# Patient Record
Sex: Male | Born: 2000
Health system: Southern US, Community
[De-identification: ages and names within clinical notes are randomized; demographics above are authoritative.]

## PROBLEM LIST (undated history)

## (undated) DIAGNOSIS — G43909 Migraine, unspecified, not intractable, without status migrainosus: Secondary | ICD-10-CM

---
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
## 2021-04-22 ENCOUNTER — Other Ambulatory Visit: Payer: Self-pay

## 2021-04-22 ENCOUNTER — Encounter (HOSPITAL_BASED_OUTPATIENT_CLINIC_OR_DEPARTMENT_OTHER): Payer: Self-pay

## 2021-04-22 ENCOUNTER — Emergency Department (HOSPITAL_BASED_OUTPATIENT_CLINIC_OR_DEPARTMENT_OTHER)
Admission: EM | Admit: 2021-04-22 | Discharge: 2021-04-22 | Disposition: A | Discharge status: Home / Self Care | Payer: 59 | Attending: Emergency Medicine | Admitting: Emergency Medicine

## 2021-04-22 DIAGNOSIS — J020 Streptococcal pharyngitis: Secondary | ICD-10-CM | POA: Insufficient documentation

## 2021-04-22 DIAGNOSIS — J029 Acute pharyngitis, unspecified: Secondary | ICD-10-CM | POA: Diagnosis present

## 2021-04-22 LAB — GROUP A STREP BY PCR: Group A Strep by PCR: DETECTED — AB

## 2021-04-22 MED ORDER — PENICILLIN G BENZATHINE 1200000 UNIT/2ML IM SUSY
1.2000 10*6.[IU] | PREFILLED_SYRINGE | Freq: Once | INTRAMUSCULAR | Status: AC
  Administered 2021-04-22: 1.2 10*6.[IU] via INTRAMUSCULAR
  Filled 2021-04-22: qty 2

## 2021-04-22 MED ORDER — PENICILLIN G BENZATHINE & PROC 1200000 UNIT/2ML IM SUSP: 2.4000 10*6.[IU] | Freq: Once | INTRAMUSCULAR | Status: DC

## 2021-04-22 MED ORDER — DEXAMETHASONE SODIUM PHOSPHATE 10 MG/ML IJ SOLN
10.0000 mg | Freq: Once | INTRAMUSCULAR | Status: AC
  Administered 2021-04-22: 10 mg via INTRAMUSCULAR
  Filled 2021-04-22: qty 1

## 2021-04-22 NOTE — ED Provider Notes (Signed)
[facility] EMERGENCY DEPARTMENT Provider Note  CSN: [id number] Arrival date & time: 04/22/21 2029  Chief Complaint(s) Sore Throat  HPI Daryl Dickson. is a 20 y.o. male    Sore Throat This is a new problem. The current episode started 2 days ago. The problem occurs constantly. The problem has been gradually worsening. Pertinent negatives include no chest pain, no abdominal pain, no headaches and no shortness of breath. The symptoms are aggravated by swallowing. Nothing relieves the symptoms. He has tried nothing for the symptoms.   Past Medical History Past Medical History:  Diagnosis Date   Migraine    Patient Active Problem List   Diagnosis Date Noted   Foot pain, right 08/06/2019   Patellar tendinitis of both knees 03/04/2018   Concussion without loss of consciousness 03/19/2017   Toe fracture, right 04/04/2016   Left wrist injury 01/06/2016   Home Medication(s) Prior to Admission medications   Medication Sig Start Date End Date Taking? Authorizing Provider  EPINEPHrine 0.3 mg/0.3 mL IJ SOAJ injection Inject 0.3 mLs (0.3 mg total) into the muscle as needed for anaphylaxis. 05/19/19   Copland, Gwenlyn Found, MD  Menaquinone-7 (VITAMIN K2 PO) Take by mouth.    [provider]  rizatriptan (MAXALT) 10 MG tablet TAKE 1 TABLET BY MOUTH AS NEEDED FOR MIGRAINE 06/22/20   Copland, Gwenlyn Found, MD  Vitamin D, Ergocalciferol, (DRISDOL) 1.25 MG (50000 UT) CAPS capsule Take 1 capsule (50,000 Units total) by mouth every 7 (seven) days. 07/31/18   Judi Saa, DO                                                                                                                                    Past Surgical History History reviewed. No pertinent surgical history. Family History Family History  Problem Relation Age of Onset   Cancer Mother    Breast cancer Mother    Cancer Father     Social History Social History   Tobacco Use   Smoking status: Never   Smokeless  tobacco: Never  Vaping Use   Vaping Use: Never used  Substance Use Topics   Alcohol use: No    Alcohol/week: 0.0 standard drinks   Drug use: No   Allergies Shellfish allergy  Review of Systems Review of Systems  Respiratory:  Negative for shortness of breath.   Cardiovascular:  Negative for chest pain.  Gastrointestinal:  Negative for abdominal pain.  Neurological:  Negative for headaches.  All other systems are reviewed and are negative for acute change except as noted in the HPI  Physical Exam Vital Signs  I have reviewed the triage vital signs BP 127/71 (BP Location: Left Arm)   Pulse 72   Temp 99.4 F (37.4 C) (Oral)   Resp 18   Ht 6\' 1"  (1.854 m)   Wt 77.6 kg   SpO2 100%   BMI 22.56 kg/m   Physical  Exam Vitals reviewed.  Constitutional:      General: He is not in acute distress.    Appearance: He is well-developed. He is not diaphoretic.  HENT:     Head: Normocephalic and atraumatic.     Jaw: No trismus.     Right Ear: External ear normal.     Left Ear: External ear normal.     Nose: Nose normal.     Mouth/Throat:     Mouth: Mucous membranes are moist.     Pharynx: No posterior oropharyngeal erythema or uvula swelling.     Tonsils: Tonsillar exudate present. No tonsillar abscesses. 2+ on the right. 2+ on the left.  Eyes:     General: No scleral icterus.    Conjunctiva/sclera: Conjunctivae normal.  Neck:     Trachea: Phonation normal.  Cardiovascular:     Rate and Rhythm: Normal rate and regular rhythm.  Pulmonary:     Effort: Pulmonary effort is normal. No respiratory distress.     Breath sounds: No stridor.  Abdominal:     General: There is no distension.  Musculoskeletal:        General: Normal range of motion.     Cervical back: Normal range of motion.  Lymphadenopathy:     Cervical: Cervical adenopathy present.  Neurological:     Mental Status: He is alert and oriented to person, place, and time.  Psychiatric:        Behavior: Behavior  normal.    ED Results and Treatments Labs (all labs ordered are listed, but only abnormal results are displayed) Labs Reviewed  GROUP A STREP BY PCR - Abnormal; Notable for the following components:      Result Value   Group A Strep by PCR DETECTED (*)    All other components within normal limits                                                                                                                         EKG  EKG Interpretation  Date/Time:    Ventricular Rate:    PR Interval:    QRS Duration:   QT Interval:    QTC Calculation:   R Axis:     Text Interpretation:         Radiology No results found.  Pertinent labs & imaging results that were available during my care of the patient were reviewed by me and considered in my medical decision making (see chart for details).  Medications Ordered in ED Medications  dexamethasone (DECADRON) injection 10 mg (10 mg Intramuscular Given 04/22/21 2140)  penicillin g benzathine (BICILLIN LA) 1200000 UNIT/2ML injection 1.2 Million Units (1.2 Million Units Intramuscular Given 04/22/21 2141)  Procedures Procedures  (including critical care time)  Medical Decision Making / ED Course I have reviewed the nursing notes for this encounter and the patient's prior records (if available in EHR or on provided paperwork).   Daryl Tromp. was evaluated in Emergency Department on 04/23/2021 for the symptoms described in the history of present illness. He was evaluated in the context of the global COVID-19 pandemic, which necessitated consideration that the patient might be at risk for infection with the SARS-CoV-2 virus that causes COVID-19. Institutional protocols and algorithms that pertain to the evaluation of patients at risk for COVID-19 are in a state of rapid change based on information released by  regulatory bodies including the CDC and federal and state organizations. These policies and algorithms were followed during the patient's care in the ED.  +strep throat. IM decadron and bicillin given      Final Clinical Impression(s) / ED Diagnoses Final diagnoses:  Strep pharyngitis    The patient appears reasonably screened and/or stabilized for discharge and I doubt any other medical condition or other [facility] requiring further screening, evaluation, or treatment in the ED at this time prior to discharge. Safe for discharge with strict return precautions.  Disposition: Discharge  Condition: Good  I have discussed the results, Dx and Tx plan with the patient/family who expressed understanding and agree(s) with the plan. Discharge instructions discussed at length. The patient/family was given strict return precautions who verbalized understanding of the instructions. No further questions at time of discharge.    ED Discharge Orders     None        Follow Up: Copland, Gwenlyn Found, MD [address] [place] [address] [phone]  Call  as needed     This chart was dictated using voice recognition software.  Despite best efforts to proofread,  errors can occur which can change the documentation meaning.    Nira Conn, MD 04/23/21 [phone]

## 2021-04-22 NOTE — ED Triage Notes (Signed)
Pt c/o sore throat, fever, chills started yesterday-NAD-steady gait

## 2021-05-30 ENCOUNTER — Other Ambulatory Visit: Payer: Self-pay | Admitting: Family Medicine

## 2021-05-30 DIAGNOSIS — G43009 Migraine without aura, not intractable, without status migrainosus: Secondary | ICD-10-CM

## 2021-06-24 ENCOUNTER — Other Ambulatory Visit: Payer: Self-pay | Admitting: Family Medicine

## 2021-06-24 DIAGNOSIS — G43009 Migraine without aura, not intractable, without status migrainosus: Secondary | ICD-10-CM

## 2022-02-10 NOTE — Progress Notes (Signed)
[facility] at [facility] [address] [place], [place] [address] [phone]  Date:  02/13/2022   Name:  Mackay Hanauer.   DOB:  02/01/2001   MRN:  [id number]  PCP:  Pearline Cables, MD    Chief Complaint: Annual Exam (Concerns/ questions: none)   History of Present Illness:  Kutler Vanvranken. is a 21 y.o. 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  Patient notes he had chlamydia last year, he was treated Covid booster HPV, meningitis UPD  He may get a migraine 2-3x a month-would like a refill of his Maxalt Headaches have not worsened recently   Patient Active Problem List   Diagnosis Date Noted   Foot pain, right 08/06/2019   Patellar tendinitis of both knees 03/04/2018   Concussion without loss of consciousness 03/19/2017   Toe fracture, right 04/04/2016   Left wrist injury 01/06/2016    Past Medical History:  Diagnosis Date   Migraine     No past surgical history on file.  Social History   Tobacco Use   Smoking status: Never   Smokeless tobacco: Never  Vaping Use   Vaping Use: Never used  Substance Use Topics   Alcohol use: No    Alcohol/week: 0.0 standard drinks   Drug use: No    Family History  Problem Relation Age of Onset   Cancer Mother    Breast cancer Mother    Cancer Father     Allergies  Allergen Reactions   Shellfish Allergy Hives    Medication list has been reviewed and updated.  Current Outpatient  Medications on File Prior to Visit  Medication Sig Dispense Refill   EPINEPHrine 0.3 mg/0.3 mL IJ SOAJ injection Inject 0.3 mLs (0.3 mg total) into the muscle as needed for anaphylaxis. 1 each prn   rizatriptan (MAXALT) 10 MG tablet TAKE 1 TABLET BY MOUTH AS NEEDED FOR MIGRAINE. 4 tablet 0   No current facility-administered medications on file prior to visit.    Review of Systems:  As per HPI- otherwise negative.   Physical Examination: Vitals:   02/13/22 1446  BP: 122/80  Pulse: 79  Resp: 18  Temp: 98.1 F (36.7 C)  SpO2: 99%   Vitals:   02/13/22 1446  Weight: 174 lb 3.2 oz (79 kg)  Height: 6' 0.52" (1.842 m)   Body mass index is 23.29 kg/m. Ideal Body Weight: Weight in (lb) to have BMI = 25: 186.6  GEN: no acute distress, normal weight, looks well Tonsillary hypertrophy is present bilaterally Bilateral TM wnl, oropharynx normal.  PEERL,EOMI.   HEENT: Atraumatic, Normocephalic.  Ears and Nose: No external deformity. CV: RRR, No M/G/R. No JVD. No thrill. No extra heart sounds. PULM: CTA B, no wheezes, crackles, rhonchi. No retractions. No resp. distress. No accessory muscle use. ABD: S, NT, ND, +BS. No rebound. No HSM. EXTR: No c/c/e PSYCH: Normally interactive. Conversant.  Patient and his parent note he tends to get pharyngitis at least 2 or 3 times a year Assessment and Plan: Physical exam  Migraine without aura and without status migrainosus, not intractable - Plan: rizatriptan (MAXALT) 10 MG tablet  Tonsillar hypertrophy - Plan: Ambulatory referral to ENT  Routine screening for STI (sexually transmitted infection) - Plan: Hepatitis B surface antigen, Hepatitis C antibody, HIV Antibody (routine testing w rflx), RPR, GC/Chlamydia Probe Amp, Hepatitis B surface antibody,quantitative, CANCELED: Hepatitis B surface antibody,quantitative, CANCELED: GC/Chlamydia Probe Amp, CANCELED: GC/Chlamydia Probe Amp  Screening, deficiency anemia, iron - Plan: CBC  Screening  for diabetes mellitus - Plan: Comprehensive metabolic panel, Hemoglobin A1c  Screening for hyperlipidemia - Plan: Lipid panel  Patient seen today for physical He plans to come back for lab work as he needs to attend an appointment They are potentially interested in having a tonsillectomy, referral made for ENT consultation Refilled Maxalt, let me know if any change or worsening of headaches  Signed Abbe Amsterdam, MD

## 2022-02-13 ENCOUNTER — Ambulatory Visit (INDEPENDENT_AMBULATORY_CARE_PROVIDER_SITE_OTHER): Payer: 59 | Admitting: Family Medicine

## 2022-02-13 VITALS — BP 122/80 | HR 79 | Temp 98.1°F | Resp 18 | Ht 72.52 in | Wt 174.2 lb

## 2022-02-13 DIAGNOSIS — J351 Hypertrophy of tonsils: Secondary | ICD-10-CM | POA: Diagnosis not present

## 2022-02-13 DIAGNOSIS — Z131 Encounter for screening for diabetes mellitus: Secondary | ICD-10-CM

## 2022-02-13 DIAGNOSIS — Z0000 Encounter for general adult medical examination without abnormal findings: Secondary | ICD-10-CM

## 2022-02-13 DIAGNOSIS — G43009 Migraine without aura, not intractable, without status migrainosus: Secondary | ICD-10-CM

## 2022-02-13 DIAGNOSIS — Z130 Encounter for screening for diseases of the blood and blood-forming organs and certain disorders involving the immune mechanism: Secondary | ICD-10-CM

## 2022-02-13 DIAGNOSIS — Z113 Encounter for screening for infections with a predominantly sexual mode of transmission: Secondary | ICD-10-CM

## 2022-02-13 DIAGNOSIS — Z13220 Encounter for screening for lipoid disorders: Secondary | ICD-10-CM

## 2022-02-13 MED ORDER — RIZATRIPTAN BENZOATE 10 MG PO TABS: 10.0000 mg | ORAL_TABLET | ORAL | 3 refills | Status: DC | PRN

## 2022-02-15 ENCOUNTER — Other Ambulatory Visit (INDEPENDENT_AMBULATORY_CARE_PROVIDER_SITE_OTHER): Payer: 59

## 2022-02-15 DIAGNOSIS — Z113 Encounter for screening for infections with a predominantly sexual mode of transmission: Secondary | ICD-10-CM

## 2022-02-15 DIAGNOSIS — Z13220 Encounter for screening for lipoid disorders: Secondary | ICD-10-CM

## 2022-02-15 DIAGNOSIS — Z131 Encounter for screening for diabetes mellitus: Secondary | ICD-10-CM | POA: Diagnosis not present

## 2022-02-15 DIAGNOSIS — Z130 Encounter for screening for diseases of the blood and blood-forming organs and certain disorders involving the immune mechanism: Secondary | ICD-10-CM

## 2022-02-15 LAB — LIPID PANEL
Cholesterol: 169 mg/dL (ref 0–200)
HDL: 69.2 mg/dL (ref >39.00)
LDL Cholesterol: 89 mg/dL (ref 0–99)
NonHDL: 99.43
Total CHOL/HDL Ratio: 2
Triglycerides: 50 mg/dL (ref 0.0–149.0)
VLDL: 10 mg/dL (ref 0.0–40.0)

## 2022-02-15 LAB — CBC
HCT: 42.1 % (ref 36.0–49.0)
Hemoglobin: 13.4 g/dL (ref 12.0–16.0)
MCHC: 31.9 g/dL (ref 31.0–37.0)
MCV: 85.2 fl (ref 78.0–98.0)
Platelets: 205 10*3/uL (ref 150.0–575.0)
RBC: 4.94 Mil/uL (ref 3.80–5.70)
RDW: 13.8 % (ref 11.4–15.5)
WBC: 8.1 10*3/uL (ref 4.5–13.5)

## 2022-02-15 LAB — COMPREHENSIVE METABOLIC PANEL
ALT: 20 U/L (ref 0–53)
AST: 27 U/L (ref 0–37)
Albumin: 4.8 g/dL (ref 3.5–5.2)
Alkaline Phosphatase: 59 U/L (ref 52–171)
BUN: 16 mg/dL (ref 6–23)
CO2: 27 mEq/L (ref 19–32)
Calcium: 9.8 mg/dL (ref 8.4–10.5)
Chloride: 105 mEq/L (ref 96–112)
Creatinine, Ser: 0.99 mg/dL (ref 0.40–1.50)
GFR: 110.07 mL/min (ref >60.00)
Glucose, Bld: 83 mg/dL (ref 70–99)
Potassium: 4.4 mEq/L (ref 3.5–5.1)
Sodium: 141 mEq/L (ref 135–145)
Total Bilirubin: 0.6 mg/dL (ref 0.2–1.2)
Total Protein: 7.8 g/dL (ref 6.0–8.3)

## 2022-02-15 LAB — GC/CHLAMYDIA PROBE AMP
Chlamydia trachomatis, NAA: NEGATIVE
Neisseria Gonorrhoeae by PCR: NEGATIVE

## 2022-02-15 LAB — HEMOGLOBIN A1C: Hgb A1c MFr Bld: 4.6 % (ref 4.6–6.5)

## 2022-02-16 LAB — HEPATITIS C ANTIBODY
Hepatitis C Ab: NONREACTIVE
SIGNAL TO CUT-OFF: 0.14 (ref <1.00)

## 2022-02-16 LAB — HEPATITIS B SURFACE ANTIGEN: Hepatitis B Surface Ag: NONREACTIVE

## 2022-02-16 LAB — RPR: RPR Ser Ql: NONREACTIVE

## 2022-02-16 LAB — HEPATITIS B SURFACE ANTIBODY, QUANTITATIVE: Hep B S AB Quant (Post): 8 m[IU]/mL — ABNORMAL LOW (ref >=10)

## 2022-02-16 LAB — HIV ANTIBODY (ROUTINE TESTING W REFLEX): HIV 1&2 Ab, 4th Generation: NONREACTIVE

## 2022-02-16 NOTE — Progress Notes (Signed)
Received labs- called pt to give results as he does not have mychart and may not want labs sent to his mom   Results for orders placed or performed in visit on 02/15/22  Hepatitis B surface antibody,quantitative  Result Value Ref Range   Hepatitis B-Post 8 (L) > OR = 10 mIU/mL  RPR  Result Value Ref Range   RPR Ser Ql NON-REACTIVE NON-REACTIVE  HIV Antibody (routine testing w rflx)  Result Value Ref Range   HIV 1&2 Ab, 4th Generation NON-REACTIVE NON-REACTIVE  Hepatitis B surface antigen  Result Value Ref Range   Hepatitis B Surface Ag NON-REACTIVE NON-REACTIVE  Lipid panel  Result Value Ref Range   Cholesterol 169 0 - 200 mg/dL   Triglycerides 23.5 0.0 - 149.0 mg/dL   HDL 57.32 >20.25 mg/dL   VLDL 42.7 0.0 - 06.2 mg/dL   LDL Cholesterol 89 0 - 99 mg/dL   Total CHOL/HDL Ratio 2    NonHDL 99.43   Hemoglobin A1c  Result Value Ref Range   Hgb A1c MFr Bld 4.6 4.6 - 6.5 %  Comprehensive metabolic panel  Result Value Ref Range   Sodium 141 135 - 145 mEq/L   Potassium 4.4 3.5 - 5.1 mEq/L   Chloride 105 96 - 112 mEq/L   CO2 27 19 - 32 mEq/L   Glucose, Bld 83 70 - 99 mg/dL   BUN 16 6 - 23 mg/dL   Creatinine, Ser 3.76 0.40 - 1.50 mg/dL   Total Bilirubin 0.6 0.2 - 1.2 mg/dL   Alkaline Phosphatase 59 52 - 171 U/L   AST 27 0 - 37 U/L   ALT 20 0 - 53 U/L   Total Protein 7.8 6.0 - 8.3 g/dL   Albumin 4.8 3.5 - 5.2 g/dL   GFR 283.15 >17.61 mL/min   Calcium 9.8 8.4 - 10.5 mg/dL  CBC  Result Value Ref Range   WBC 8.1 4.5 - 13.5 K/uL   RBC 4.94 3.80 - 5.70 Mil/uL   Platelets 205.0 150.0 - 575.0 K/uL   Hemoglobin 13.4 12.0 - 16.0 g/dL   HCT 60.7 37.1 - 06.2 %   MCV 85.2 78.0 - 98.0 fl   MCHC 31.9 31.0 - 37.0 g/dL   RDW 69.4 85.4 - 62.7 %

## 2022-04-21 NOTE — Progress Notes (Unsigned)
[facility] at [facility] [address], [address] [place], [place] [address] [phone]  Date:  04/24/2022   Name:  Daryl Dickson.   DOB:  2001-02-19   MRN:  [id number]  PCP:  Pearline Cables, MD    Chief Complaint: No chief complaint on file.   History of Present Illness:  Daryl Dickson. is a 21 y.o. very pleasant male patient who presents with the following:  Pt seen today to discuss migraine headache Last seen by myself in May for his physical  : Last seen by myself 6/21-at that time he had recently graduated from high school. 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  Social History   Tobacco Use   Smoking status: Never   Smokeless tobacco: Never  Vaping Use   Vaping Use: Never used  Substance Use Topics   Alcohol use: No    Alcohol/week: 0.0 standard drinks of alcohol   Drug use: No    Family History  Problem Relation Age of Onset   Cancer Mother    Breast cancer Mother    Cancer Father     Allergies  Allergen Reactions   Shellfish Allergy Hives    Medication list has been reviewed and updated.  Current Outpatient Medications on File Prior to Visit  Medication Sig Dispense Refill    EPINEPHrine 0.3 mg/0.3 mL IJ SOAJ injection Inject 0.3 mLs (0.3 mg total) into the muscle as needed for anaphylaxis. 1 each prn   rizatriptan (MAXALT) 10 MG tablet Take 1 tablet (10 mg total) by mouth as needed for migraine. May repeat in 2 hours if needed 12 tablet 3   No current facility-administered medications on file prior to visit.    Review of Systems:  As per HPI- otherwise negative.   Physical Examination: There were no vitals filed for this visit. There were no vitals filed for this visit. There is no height or weight on file to calculate BMI. Ideal Body Weight:    GEN: no acute distress. HEENT: Atraumatic, Normocephalic.  Ears and Nose: No external deformity. CV: RRR, No M/G/R. No JVD. No thrill. No extra heart sounds. PULM: CTA B, no wheezes, crackles, rhonchi. No retractions. No resp. distress. No accessory muscle use. ABD: S, NT, ND, +BS. No rebound. No HSM. EXTR: No c/c/e PSYCH: Normally interactive. Conversant.    Assessment and Plan: ***  Signed Abbe Amsterdam, MD

## 2022-04-24 ENCOUNTER — Ambulatory Visit: Payer: 59 | Admitting: Family Medicine

## 2022-04-24 ENCOUNTER — Telehealth: Payer: Self-pay | Admitting: Family Medicine

## 2022-04-24 VITALS — BP 108/62 | HR 75 | Temp 97.6°F | Resp 18 | Ht 72.52 in | Wt 173.8 lb

## 2022-04-24 DIAGNOSIS — G43009 Migraine without aura, not intractable, without status migrainosus: Secondary | ICD-10-CM

## 2022-04-24 MED ORDER — NURTEC 75 MG PO TBDP: 75.0000 mg | ORAL_TABLET | Freq: Every day | ORAL | 1 refills | Status: DC | PRN

## 2022-04-24 NOTE — Telephone Encounter (Signed)
PA initiated via Covermymeds; KEY: [id number].   Plan requires OV notes- I'm unable to attach them via covermymeds; I have asked plan to call us with their fax number.

## 2022-04-24 NOTE — Patient Instructions (Signed)
It was good to see you today- let's try Nurtec for your headaches.  Take at the first sign of a migraine Please let me know . . .  -if you have any trouble getting this paid for by your insurance and filled -how it works for you!   Take care and best of luck in [place]!

## 2022-04-25 MED ORDER — NURTEC 75 MG PO TBDP: 75.0000 mg | ORAL_TABLET | Freq: Every day | ORAL | 3 refills | Status: DC | PRN

## 2022-04-25 NOTE — Telephone Encounter (Signed)
FYI. PA denied. However plan does cover 8 tablets per month.   The request for 15 NURTEC TAB 75MG  ODT per month is denied. This decision is based on health plan criteria for NURTEC TAB 75MG  ODT. More than 8 tablets per month is covered only if: You require treatment of more than eight migraines per month. The information provided does not show that you meet the criteria listed above. *Please note: NURTEC TAB 75MG  ODT has been approved for up to 8 per month until 04/25/2023 or until coverage for the medication is no longer available under your benefit plan or the medication becomes subject to a pharmacy benefit coverage requirement, such as supply limits or notification, whichever occurs first as allowed by law.

## 2022-04-25 NOTE — Telephone Encounter (Signed)
See below

## 2022-10-13 ENCOUNTER — Other Ambulatory Visit: Payer: Self-pay | Admitting: Family Medicine

## 2022-10-13 DIAGNOSIS — G43009 Migraine without aura, not intractable, without status migrainosus: Secondary | ICD-10-CM

## 2023-09-23 ENCOUNTER — Telehealth: Payer: Self-pay | Admitting: Family Medicine

## 2023-09-23 DIAGNOSIS — G43009 Migraine without aura, not intractable, without status migrainosus: Secondary | ICD-10-CM

## 2023-09-24 ENCOUNTER — Other Ambulatory Visit (HOSPITAL_COMMUNITY): Payer: Self-pay

## 2023-09-24 NOTE — Telephone Encounter (Signed)
History of trial/failure of 2 triptan therapies required or history of contraindication of 2 triptan therapies. Please advise as chart states that patient has only tried rizatriptan.

## 2023-09-25 NOTE — Telephone Encounter (Signed)
 Left vm to return call.

## 2024-02-25 NOTE — Progress Notes (Unsigned)
 [facility] at [facility] [address] [place], [place] [address] [phone]  Date:  02/27/2024   Name:  Daryl Dickson.   DOB:  12-Jan-2001   MRN:  [id number]  PCP:  Kaylee Partridge, MD    Chief Complaint: No chief complaint on file.   History of Present Illness:  Daryl Grill. is a 23 y.o. very pleasant male patient who presents with the following:  Today for medication follow-up Most recent visit with myself was about 2 years ago-that time his migraine headaches were relatively stable but still significant.  We tried him on Nurtec-previously had only used Maxalt   Can update blood work today Due for tetanus booster  Patient Active Problem List   Diagnosis Date Noted   Foot pain, right 08/06/2019   Patellar tendinitis of both knees 03/04/2018   Concussion without loss of consciousness 03/19/2017   Toe fracture, right 04/04/2016   Left wrist injury 01/06/2016    Past Medical History:  Diagnosis Date   Migraine     No past surgical history on file.  Social History   Tobacco Use   Smoking status: Never   Smokeless tobacco: Never  Vaping Use   Vaping status: Never Used  Substance Use Topics   Alcohol use: No    Alcohol/week: 0.0 standard drinks of alcohol   Drug use: No    Family History  Problem Relation Age of Onset   Cancer Mother    Breast cancer Mother    Cancer Father     Allergies  Allergen Reactions   Shellfish Allergy Hives    Medication list has been reviewed and updated.  Current Outpatient Medications on File Prior to Visit  Medication Sig Dispense Refill   EPINEPHrine  0.3 mg/0.3 mL IJ SOAJ injection Inject 0.3 mLs (0.3 mg total) into the muscle as needed for anaphylaxis. 1 each prn   NURTEC 75 MG TBDP TAKE 75 MG BY MOUTH DAILY AS NEEDED. 8 tablet 3   rizatriptan  (MAXALT ) 10 MG tablet Take 1 tablet (10 mg total) by mouth as needed for migraine. May repeat in 2 hours if needed 12  tablet 3   No current facility-administered medications on file prior to visit.    Review of Systems:  As per HPI- otherwise negative.   Physical Examination: There were no vitals filed for this visit. There were no vitals filed for this visit. There is no height or weight on file to calculate BMI. Ideal Body Weight:    GEN: no acute distress. HEENT: Atraumatic, Normocephalic.  Ears and Nose: No external deformity. CV: RRR, No M/G/R. No JVD. No thrill. No extra heart sounds. PULM: CTA B, no wheezes, crackles, rhonchi. No retractions. No resp. distress. No accessory muscle use. ABD: S, NT, ND, +BS. No rebound. No HSM. EXTR: No c/c/e PSYCH: Normally interactive. Conversant.    Assessment and Plan: ***  Signed Gates Kasal, MD

## 2024-02-25 NOTE — Patient Instructions (Incomplete)
 It was good to see you today!

## 2024-02-27 ENCOUNTER — Other Ambulatory Visit (HOSPITAL_COMMUNITY)
Admission: RE | Admit: 2024-02-27 | Discharge: 2024-02-27 | Disposition: A | Discharge status: Home / Self Care | Source: Ambulatory Visit | Attending: Family Medicine | Admitting: Family Medicine

## 2024-02-27 ENCOUNTER — Ambulatory Visit: Admitting: Family Medicine

## 2024-02-27 ENCOUNTER — Encounter: Payer: Self-pay | Admitting: Family Medicine

## 2024-02-27 VITALS — BP 112/78 | HR 51 | Ht 72.0 in | Wt 166.6 lb

## 2024-02-27 DIAGNOSIS — Z13220 Encounter for screening for lipoid disorders: Secondary | ICD-10-CM | POA: Diagnosis not present

## 2024-02-27 DIAGNOSIS — Z131 Encounter for screening for diabetes mellitus: Secondary | ICD-10-CM

## 2024-02-27 DIAGNOSIS — G43009 Migraine without aura, not intractable, without status migrainosus: Secondary | ICD-10-CM | POA: Diagnosis not present

## 2024-02-27 DIAGNOSIS — Z113 Encounter for screening for infections with a predominantly sexual mode of transmission: Secondary | ICD-10-CM | POA: Diagnosis not present

## 2024-02-27 DIAGNOSIS — Z23 Encounter for immunization: Secondary | ICD-10-CM | POA: Diagnosis not present

## 2024-02-27 DIAGNOSIS — A749 Chlamydial infection, unspecified: Secondary | ICD-10-CM

## 2024-02-27 DIAGNOSIS — Z130 Encounter for screening for diseases of the blood and blood-forming organs and certain disorders involving the immune mechanism: Secondary | ICD-10-CM

## 2024-02-27 LAB — CBC
HCT: 43.3 % (ref 39.0–52.0)
Hemoglobin: 14 g/dL (ref 13.0–17.0)
MCHC: 32.3 g/dL (ref 30.0–36.0)
MCV: 84.3 fl (ref 78.0–100.0)
Platelets: 246 10*3/uL (ref 150.0–400.0)
RBC: 5.14 Mil/uL (ref 4.22–5.81)
RDW: 13.2 % (ref 11.5–15.5)
WBC: 6.1 10*3/uL (ref 4.0–10.5)

## 2024-02-27 LAB — COMPREHENSIVE METABOLIC PANEL WITH GFR
ALT: 17 U/L (ref 0–53)
AST: 30 U/L (ref 0–37)
Albumin: 4.9 g/dL (ref 3.5–5.2)
Alkaline Phosphatase: 67 U/L (ref 39–117)
BUN: 15 mg/dL (ref 6–23)
CO2: 29 meq/L (ref 19–32)
Calcium: 10.3 mg/dL (ref 8.4–10.5)
Chloride: 106 meq/L (ref 96–112)
Creatinine, Ser: 1.1 mg/dL (ref 0.40–1.50)
GFR: 95.62 mL/min (ref >60.00)
Glucose, Bld: 94 mg/dL (ref 70–99)
Potassium: 4.5 meq/L (ref 3.5–5.1)
Sodium: 144 meq/L (ref 135–145)
Total Bilirubin: 0.6 mg/dL (ref 0.2–1.2)
Total Protein: 7.9 g/dL (ref 6.0–8.3)

## 2024-02-27 LAB — LIPID PANEL
Cholesterol: 190 mg/dL (ref 0–200)
HDL: 84.3 mg/dL (ref >39.00)
LDL Cholesterol: 96 mg/dL (ref 0–99)
NonHDL: 105.36
Total CHOL/HDL Ratio: 2
Triglycerides: 49 mg/dL (ref 0.0–149.0)
VLDL: 9.8 mg/dL (ref 0.0–40.0)

## 2024-02-27 LAB — HEMOGLOBIN A1C: Hgb A1c MFr Bld: 4.7 % (ref 4.6–6.5)

## 2024-02-27 MED ORDER — RIZATRIPTAN BENZOATE 10 MG PO TABS: 10.0000 mg | ORAL_TABLET | ORAL | 3 refills | Status: DC | PRN

## 2024-02-28 LAB — RPR: RPR Ser Ql: NONREACTIVE

## 2024-02-28 LAB — HEPATITIS B SURFACE ANTIGEN: Hepatitis B Surface Ag: NONREACTIVE

## 2024-02-28 LAB — CYTOLOGY, (ORAL, ANAL, URETHRAL) ANCILLARY ONLY
Chlamydia: POSITIVE — AB
Comment: NEGATIVE
Comment: NEGATIVE
Comment: NORMAL
Neisseria Gonorrhea: NEGATIVE
Trichomonas: NEGATIVE

## 2024-02-28 LAB — HEPATITIS C ANTIBODY: Hepatitis C Ab: NONREACTIVE

## 2024-02-28 LAB — HIV ANTIBODY (ROUTINE TESTING W REFLEX): HIV 1&2 Ab, 4th Generation: NONREACTIVE

## 2024-02-28 MED ORDER — DOXYCYCLINE HYCLATE 100 MG PO CAPS
100.0000 mg | ORAL_CAPSULE | Freq: Two times a day (BID) | ORAL | 0 refills | Status: DC
Start: 2024-02-28 — End: 2024-05-05

## 2024-02-28 NOTE — Addendum Note (Signed)
 Addended by: Gates Kasal C on: 02/28/2024 07:40 PM   Modules accepted: Orders

## 2024-03-06 ENCOUNTER — Telehealth: Payer: Self-pay | Admitting: *Deleted

## 2024-03-06 NOTE — Telephone Encounter (Signed)
 Copied from CRM [id number]. Topic: General - Other >> Mar 06, 2024 11:14 AM Annelle Kiel wrote: Reason for CRM: patient would like a call back regarding some medication he was prescribed

## 2024-03-06 NOTE — Telephone Encounter (Signed)
 Called him back-he had a question about medication timing which I answered

## 2024-05-05 ENCOUNTER — Ambulatory Visit: Admitting: Family Medicine

## 2024-05-05 ENCOUNTER — Encounter: Payer: Self-pay | Admitting: Family Medicine

## 2024-05-05 VITALS — BP 110/68 | HR 93 | Temp 98.0°F | Resp 16 | Ht 73.0 in | Wt 170.0 lb

## 2024-05-05 DIAGNOSIS — L0291 Cutaneous abscess, unspecified: Secondary | ICD-10-CM

## 2024-05-05 MED ORDER — OXYCODONE HCL 5 MG PO TABS: 5.0000 mg | ORAL_TABLET | ORAL | 0 refills | Status: AC | PRN

## 2024-05-05 MED ORDER — DOXYCYCLINE HYCLATE 100 MG PO TABS: 100.0000 mg | ORAL_TABLET | Freq: Two times a day (BID) | ORAL | 0 refills | Status: AC

## 2024-05-05 NOTE — Patient Instructions (Addendum)
 Do not shower for the rest of the day. When you do wash it, use only soap and water. Do not vigorously scrub. Keep the area clean and dry.   Things to look out for: increasing pain not relieved by ibuprofen /acetaminophen , fevers, spreading redness, drainage of pus, or foul odor.  If the packing falls out, that is fine.   Do not drink alcohol, do any illicit/street drugs, drive or do anything that requires alertness while on this medicine.   Ice/cold pack over area for 10-15 min twice daily.  OK to take Tylenol  1000 mg (2 extra strength tabs) or 975 mg (3 regular strength tabs) every 6 hours as needed.  Ibuprofen  400-600 mg (2-3 over the counter strength tabs) every 6 hours as needed for pain.  Let us  know if you need anything.

## 2024-05-05 NOTE — Progress Notes (Signed)
 Chief Complaint  Patient presents with   Cyst    Cyst    Daryl Dickson. is a 23 y.o. male here for a skin complaint.  Duration: 1 day Location: anal area Pruritic? No Painful? Yes Drainage? No New soaps/lotions/topicals/detergents? No Trauma? No Other associated symptoms: no fevers, spreading redness Therapies tried thus far: none  Past Medical History:  Diagnosis Date   Migraine     BP 110/68 (BP Location: Left Arm, Patient Position: Sitting)   Pulse 93   Temp 98 F (36.7 C) (Oral)   Resp 16   Ht 6' 1 (1.854 m)   Wt 170 lb (77.1 kg)   SpO2 96%   BMI 22.43 kg/m  Gen: awake, alert, appearing stated age Lungs: No accessory muscle use Skin: L prox gluteal cleft there is a slightly raised elliptically shaped lesion with warmth, fluctuance and ttp. No drainage, erythema, excoriation Psych: Age appropriate judgment and insight  Procedure note; incision and drainage Informed consent obtained. The area was cleaned with alcohol. The area was anesthetized with 5 mL of 1% lidocaine with epinephrine . Once adequate anesthesia was obtained, a cruciate incision was made with 11 blade scalpel. Approximately 10 mL of purulent material with blood was expressed. Cultures were taken. Loculations were interrupted with a curved hemostat. The area was packed with approximately 3 cm of 1/4 in iodoform gauze. The area was then dressed with a bandage. There were no complications noted. The patient tolerated the procedure well.  Abscess - Plan: PR INCISION & DRAINAGE ABSCESS SIMPLE/SINGLE, doxycycline  (VIBRA -TABS) 100 MG tablet, oxyCODONE  (OXY IR/ROXICODONE ) 5 MG immediate release tablet  I&D today. Warnings about pain medicine provided. Tylenol , ice, ibuprofen  prn. Keep c/d. Pull packing in 1 week.  The patient voiced understanding and agreement to the plan.  Mabel [place], DO 05/05/24 10:39 AM

## 2024-05-13 ENCOUNTER — Ambulatory Visit: Payer: Self-pay

## 2024-05-13 NOTE — Telephone Encounter (Signed)
 FYI Only or Action Required?: FYI only for provider.  Patient was last seen in primary care on 05/05/2024 by Frann Mabel Mt, DO.  Called Nurse Triage reporting Sore Throat left side of throat more swollen.  Symptoms began Sunday.  Interventions attempted: OTC medications: salt water gargl;es, lozenges, chloraseptic spray.  Symptoms are: unchanged.  Triage Disposition: See PCP When Office is Open (Within 3 Days)  Patient/caregiver understands and will follow disposition?: yes  Pt given address of mobile bus. Pt stated he may go to [facility] and if so, will callback to cancel appt.       Copied from CRM [id number]. Topic: Clinical - Red Word Triage >> May 13, 2024 10:26 AM Pinkey ORN wrote: Red Word that prompted transfer to Nurse Triage: Swollen Left Side Of Throat >> May 13, 2024 10:28 AM Pinkey ORN wrote: Patient has been experiencing a sore throat, swollen throat and difficulty swallowing since Sunday Reason for Disposition  [1] Sore throat is the only symptom AND [2] present > 48 hours  Answer Assessment - Initial Assessment Questions 1. ONSET: When did the throat start hurting? (Hours or days ago)      Sunday  2. SEVERITY: How bad is the sore throat? (Scale 1-10; mild, moderate or severe)     7/10 3. STREP EXPOSURE: Has there been any exposure to strep within the past week? If Yes, ask: What type of contact occurred?      no 4.  VIRAL SYMPTOMS: Are there any symptoms of a cold, such as a runny nose, cough, hoarse voice or red eyes?      no 5. FEVER: Do you have a fever? If Yes, ask: What is your temperature, how was it measured, and when did it start?     no 6. PUS ON THE TONSILS: Is there pus on the tonsils in the back of your throat?     no 7. OTHER SYMPTOMS: Do you have any other symptoms? (e.g., difficulty breathing, headache, rash)    Diff swallowing, left side of throat hurts and is swollen  Protocols used: Sore Throat-A-AH

## 2024-05-14 ENCOUNTER — Ambulatory Visit: Admitting: Physician Assistant

## 2024-05-15 ENCOUNTER — Other Ambulatory Visit: Payer: Self-pay

## 2024-05-15 ENCOUNTER — Emergency Department (HOSPITAL_BASED_OUTPATIENT_CLINIC_OR_DEPARTMENT_OTHER)
Admission: EM | Admit: 2024-05-15 | Discharge: 2024-05-15 | Disposition: A | Discharge status: Home / Self Care | Attending: Emergency Medicine | Admitting: Emergency Medicine

## 2024-05-15 ENCOUNTER — Emergency Department (HOSPITAL_BASED_OUTPATIENT_CLINIC_OR_DEPARTMENT_OTHER)

## 2024-05-15 ENCOUNTER — Encounter (HOSPITAL_BASED_OUTPATIENT_CLINIC_OR_DEPARTMENT_OTHER): Payer: Self-pay | Admitting: Emergency Medicine

## 2024-05-15 DIAGNOSIS — J029 Acute pharyngitis, unspecified: Secondary | ICD-10-CM | POA: Diagnosis present

## 2024-05-15 DIAGNOSIS — J020 Streptococcal pharyngitis: Secondary | ICD-10-CM | POA: Diagnosis not present

## 2024-05-15 LAB — CBC WITH DIFFERENTIAL/PLATELET
Abs Immature Granulocytes: 0.05 K/uL (ref 0.00–0.07)
Basophils Absolute: 0.1 K/uL (ref 0.0–0.1)
Basophils Relative: 0 %
Eosinophils Absolute: 0.1 K/uL (ref 0.0–0.5)
Eosinophils Relative: 1 %
HCT: 40.8 % (ref 39.0–52.0)
Hemoglobin: 13.3 g/dL (ref 13.0–17.0)
Immature Granulocytes: 0 %
Lymphocytes Relative: 12 %
Lymphs Abs: 1.6 K/uL (ref 0.7–4.0)
MCH: 26.7 pg (ref 26.0–34.0)
MCHC: 32.6 g/dL (ref 30.0–36.0)
MCV: 81.9 fL (ref 80.0–100.0)
Monocytes Absolute: 1.3 K/uL — ABNORMAL HIGH (ref 0.1–1.0)
Monocytes Relative: 10 %
Neutro Abs: 10.9 K/uL — ABNORMAL HIGH (ref 1.7–7.7)
Neutrophils Relative %: 77 %
Platelets: 349 K/uL (ref 150–400)
RBC: 4.98 MIL/uL (ref 4.22–5.81)
RDW: 11.8 % (ref 11.5–15.5)
WBC: 14 K/uL — ABNORMAL HIGH (ref 4.0–10.5)
nRBC: 0 % (ref 0.0–0.2)

## 2024-05-15 LAB — RESP PANEL BY RT-PCR (RSV, FLU A&B, COVID)  RVPGX2
Influenza A by PCR: NEGATIVE
Influenza B by PCR: NEGATIVE
Resp Syncytial Virus by PCR: NEGATIVE
SARS Coronavirus 2 by RT PCR: NEGATIVE

## 2024-05-15 LAB — BASIC METABOLIC PANEL WITH GFR
Anion gap: 13 (ref 5–15)
BUN: 13 mg/dL (ref 6–20)
CO2: 25 mmol/L (ref 22–32)
Calcium: 10 mg/dL (ref 8.9–10.3)
Chloride: 100 mmol/L (ref 98–111)
Creatinine, Ser: 1.19 mg/dL (ref 0.61–1.24)
GFR, Estimated: >60 mL/min (ref >60)
Glucose, Bld: 93 mg/dL (ref 70–99)
Potassium: 4.1 mmol/L (ref 3.5–5.1)
Sodium: 138 mmol/L (ref 135–145)

## 2024-05-15 LAB — MONONUCLEOSIS SCREEN: Mono Screen: NEGATIVE

## 2024-05-15 LAB — GROUP A STREP BY PCR: Group A Strep by PCR: DETECTED — AB

## 2024-05-15 MED ORDER — CLINDAMYCIN HCL 150 MG PO CAPS: 300.0000 mg | ORAL_CAPSULE | Freq: Three times a day (TID) | ORAL | 0 refills | Status: AC

## 2024-05-15 MED ORDER — IOHEXOL 300 MG/ML  SOLN
100.0000 mL | Freq: Once | INTRAMUSCULAR | Status: AC | PRN
  Administered 2024-05-15: 75 mL via INTRAVENOUS

## 2024-05-15 MED ORDER — DEXAMETHASONE SODIUM PHOSPHATE 10 MG/ML IJ SOLN
10.0000 mg | Freq: Once | INTRAMUSCULAR | Status: AC
  Administered 2024-05-15: 10 mg via INTRAVENOUS
  Filled 2024-05-15: qty 1

## 2024-05-15 MED ORDER — CLINDAMYCIN PHOSPHATE 300 MG/50ML IV SOLN
300.0000 mg | Freq: Once | INTRAVENOUS | Status: AC
  Administered 2024-05-15: 300 mg via INTRAVENOUS
  Filled 2024-05-15: qty 50

## 2024-05-15 MED ORDER — SODIUM CHLORIDE 0.9 % IV BOLUS
1000.0000 mL | Freq: Once | INTRAVENOUS | Status: AC
  Administered 2024-05-15: 1000 mL via INTRAVENOUS

## 2024-05-15 NOTE — ED Notes (Signed)
 Patient transported to CT

## 2024-05-15 NOTE — Discharge Instructions (Addendum)
 Take your antibiotics as prescribed.  You can continue your viscous lidocaine as needed.  Alternate Tylenol  Motrin  every 3 hours as needed for pain and fever.

## 2024-05-15 NOTE — ED Provider Notes (Signed)
 I took over care of this patient at 7 AM pending remainder of workup. Patient is positive for strep throat.  Physical Exam  BP 123/79   Pulse 80   Temp 98.8 F (37.1 C) (Oral)   Resp 20   Ht 6' 1 (1.854 m)   Wt 77.1 kg   SpO2 100%   BMI 22.43 kg/m   Physical Exam Vitals and nursing note reviewed.  Constitutional:      General: He is not in acute distress.    Appearance: He is well-developed.  HENT:     Head: Normocephalic and atraumatic.  Eyes:     Conjunctiva/sclera: Conjunctivae normal.  Cardiovascular:     Rate and Rhythm: Normal rate and regular rhythm.     Heart sounds: No murmur heard. Pulmonary:     Effort: Pulmonary effort is normal. No respiratory distress.     Breath sounds: Normal breath sounds.  Abdominal:     Palpations: Abdomen is soft.     Tenderness: There is no abdominal tenderness.  Musculoskeletal:        General: No swelling.     Cervical back: Neck supple.  Skin:    General: Skin is warm and dry.     Capillary Refill: Capillary refill takes less than 2 seconds.  Neurological:     Mental Status: He is alert.  Psychiatric:        Mood and Affect: Mood normal.     Procedures  Procedures  ED Course / MDM    Medical Decision Making Patient feeling better after Decadron .  CT reviewed and there is no signs of abscess.  He is positive for strep but other tests are negative.  Gave him a dose of clindamycin  here he also received IV fluids.  Advised Tylenol  Motrin  as needed for pain, I will start him on clindamycin  and advise follow-up with primary care as needed.  He feels comfortable with the plan to be discharged home.  Problems Addressed: Strep throat: acute illness or injury  Amount and/or Complexity of Data Reviewed Labs: ordered. Decision-making details documented in ED Course.    Details: Reviewed and strep was positive Radiology: ordered and independent interpretation performed. Decision-making details documented in ED Course.    Details:  CT soft tissue neck reviewed and shows no signs of abscess just tonsillitis  Risk OTC drugs. Prescription drug management.          Daryl Duwaine CROME, DO 05/15/24 [phone]

## 2024-05-15 NOTE — ED Triage Notes (Signed)
 Sore throat left side swelling, seen at [facility] on Monday, neg swabs, given oral lidocaine and azithromycin. Not getting better.

## 2024-05-15 NOTE — ED Provider Notes (Signed)
 [facility] Provider Note   CSN: [id number] Arrival date & time: 05/15/24  0600     Patient presents with: Sore Throat   Daryl Dickson. is a 23 y.o. male.   Is a 23 year old male with no significant past medical history.  Patient presenting with complaints of sore throat.  He was seen in urgent care several days ago where he had a COVID and strep test performed, both were negative.  He was prescribed Zithromax and oral lidocaine, however symptoms have worsened.  He describes worsening inflammation and pain of his throat and difficulty swallowing.  No fevers or chills.  No sick contacts.       Prior to Admission medications   Medication Sig Start Date End Date Taking? Authorizing Provider  doxycycline  (VIBRA -TABS) 100 MG tablet Take 1 tablet (100 mg total) by mouth 2 (two) times daily. 05/05/24   Frann Mabel Mt, DO  EPINEPHrine  0.3 mg/0.3 mL IJ SOAJ injection Inject 0.3 mLs (0.3 mg total) into the muscle as needed for anaphylaxis. 05/19/19   Copland, Harlene BROCKS, MD  oxyCODONE  (OXY IR/ROXICODONE ) 5 MG immediate release tablet Take 1 tablet (5 mg total) by mouth every 4 (four) hours as needed for severe pain (pain score 7-10). 05/05/24   Wendling, Mabel Mt, DO  rizatriptan  (MAXALT ) 10 MG tablet Take 1 tablet (10 mg total) by mouth as needed for migraine. May repeat in 2 hours if needed 02/27/24   Copland, Harlene BROCKS, MD    Allergies: Shellfish allergy    Review of Systems  All other systems reviewed and are negative.   Updated Vital Signs BP 123/79   Pulse 80   Temp 98.8 F (37.1 C) (Oral)   Resp 20   Ht 6' 1 (1.854 m)   Wt 77.1 kg   SpO2 100%   BMI 22.43 kg/m   Physical Exam Vitals and nursing note reviewed.  Constitutional:      General: He is not in acute distress.    Appearance: He is well-developed. He is not diaphoretic.  HENT:     Head: Normocephalic and atraumatic.     Mouth/Throat:     Mouth: Mucous  membranes are moist.     Pharynx: Pharyngeal swelling and posterior oropharyngeal erythema present. No oropharyngeal exudate.     Tonsils: Tonsillar abscess present. No tonsillar exudate.  Cardiovascular:     Rate and Rhythm: Normal rate and regular rhythm.     Heart sounds: No murmur heard.    No friction rub.  Pulmonary:     Effort: Pulmonary effort is normal. No respiratory distress.     Breath sounds: Normal breath sounds. No wheezing or rales.  Abdominal:     General: Bowel sounds are normal. There is no distension.     Palpations: Abdomen is soft.     Tenderness: There is no abdominal tenderness.  Musculoskeletal:        General: Normal range of motion.     Cervical back: Normal range of motion and neck supple.  Skin:    General: Skin is warm and dry.  Neurological:     Mental Status: He is alert and oriented to person, place, and time.     Coordination: Coordination normal.     (all labs ordered are listed, but only abnormal results are displayed) Labs Reviewed  GROUP A STREP BY PCR  RESP PANEL BY RT-PCR (RSV, FLU A&B, COVID)  [id number]  BASIC METABOLIC PANEL WITH GFR  CBC WITH DIFFERENTIAL/PLATELET  MONONUCLEOSIS SCREEN    EKG: None  Radiology: No results found.   Procedures   Medications Ordered in the ED  sodium chloride  0.9 % bolus 1,000 mL (has no administration in time range)  dexamethasone  (DECADRON ) injection 10 mg (has no administration in time range)                                    Medical Decision Making Amount and/or Complexity of Data Reviewed Labs: ordered. Radiology: ordered.  Risk Prescription drug management.   Patient presenting with worsening sore throat despite Zithromax and oral lidocaine prescribed at urgent care.  He arrives here with stable vital signs and is afebrile.  Physical examination reveals diffuse erythema to the posterior oropharynx along with increased secretions.  Laboratory studies have been ordered including  CBC, metabolic panel, monoscreen, COVID, and strep tests.  I have ordered IV fluids along with Decadron .  Results of the above tests are pending.  Care will be signed out to oncoming provider at shift change.     Final diagnoses:  None    ED Discharge Orders     None          Geroldine Berg, MD 05/15/24 [phone]

## 2024-06-26 ENCOUNTER — Other Ambulatory Visit: Payer: Self-pay | Admitting: Family Medicine

## 2024-06-26 DIAGNOSIS — G43009 Migraine without aura, not intractable, without status migrainosus: Secondary | ICD-10-CM

## 2024-10-16 ENCOUNTER — Other Ambulatory Visit: Payer: Self-pay | Admitting: Family Medicine

## 2024-10-16 DIAGNOSIS — G43009 Migraine without aura, not intractable, without status migrainosus: Secondary | ICD-10-CM
